# Patient Record
Sex: Male | Born: 1990 | Hispanic: No | Marital: Married | Smoking: Never smoker
Health system: Southern US, Community
[De-identification: ages and names within clinical notes are randomized; demographics above are authoritative.]

## PROBLEM LIST (undated history)

## (undated) DIAGNOSIS — R079 Chest pain, unspecified: Secondary | ICD-10-CM

## (undated) DIAGNOSIS — R0602 Shortness of breath: Secondary | ICD-10-CM

## (undated) HISTORY — DX: Chest pain, unspecified: R07.9

## (undated) HISTORY — DX: Shortness of breath: R06.02

---
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
## 2008-11-27 ENCOUNTER — Ambulatory Visit: Payer: Self-pay | Admitting: Cardiology

## 2008-11-27 DIAGNOSIS — R079 Chest pain, unspecified: Secondary | ICD-10-CM | POA: Insufficient documentation

## 2008-11-27 DIAGNOSIS — R002 Palpitations: Secondary | ICD-10-CM | POA: Insufficient documentation

## 2009-02-08 ENCOUNTER — Observation Stay (HOSPITAL_COMMUNITY): Admission: EM | Admit: 2009-02-08 | Discharge: 2009-02-09 | Payer: Self-pay | Admitting: Emergency Medicine

## 2009-03-11 ENCOUNTER — Emergency Department (HOSPITAL_COMMUNITY): Admission: EM | Admit: 2009-03-11 | Discharge: 2009-03-12 | Payer: Self-pay | Admitting: Emergency Medicine

## 2010-07-31 LAB — PROTIME-INR
INR: 1.05 (ref 0.00–1.49)
Prothrombin Time: 13.6 seconds (ref 11.6–15.2)

## 2010-07-31 LAB — POCT I-STAT, CHEM 8
BUN: 11 mg/dL (ref 6–23)
Calcium, Ion: 1.16 mmol/L (ref 1.12–1.32)
Chloride: 107 mEq/L (ref 96–112)
HCT: 44 % (ref 39.0–52.0)

## 2011-12-04 ENCOUNTER — Encounter: Payer: Self-pay | Admitting: Cardiology

## 2016-02-01 ENCOUNTER — Encounter (HOSPITAL_BASED_OUTPATIENT_CLINIC_OR_DEPARTMENT_OTHER): Payer: Self-pay | Admitting: Emergency Medicine

## 2016-02-01 ENCOUNTER — Emergency Department (HOSPITAL_BASED_OUTPATIENT_CLINIC_OR_DEPARTMENT_OTHER)
Admission: EM | Admit: 2016-02-01 | Discharge: 2016-02-01 | Disposition: A | Discharge status: Home / Self Care | Payer: Worker's Compensation | Attending: Emergency Medicine | Admitting: Emergency Medicine

## 2016-02-01 ENCOUNTER — Emergency Department (HOSPITAL_BASED_OUTPATIENT_CLINIC_OR_DEPARTMENT_OTHER): Payer: Worker's Compensation

## 2016-02-01 DIAGNOSIS — S161XXA Strain of muscle, fascia and tendon at neck level, initial encounter: Secondary | ICD-10-CM | POA: Insufficient documentation

## 2016-02-01 DIAGNOSIS — Y92410 Unspecified street and highway as the place of occurrence of the external cause: Secondary | ICD-10-CM | POA: Diagnosis not present

## 2016-02-01 DIAGNOSIS — S0990XA Unspecified injury of head, initial encounter: Secondary | ICD-10-CM | POA: Diagnosis present

## 2016-02-01 DIAGNOSIS — S20219A Contusion of unspecified front wall of thorax, initial encounter: Secondary | ICD-10-CM | POA: Insufficient documentation

## 2016-02-01 DIAGNOSIS — S0091XA Abrasion of unspecified part of head, initial encounter: Secondary | ICD-10-CM | POA: Insufficient documentation

## 2016-02-01 DIAGNOSIS — R079 Chest pain, unspecified: Secondary | ICD-10-CM | POA: Diagnosis not present

## 2016-02-01 DIAGNOSIS — Y9389 Activity, other specified: Secondary | ICD-10-CM | POA: Diagnosis not present

## 2016-02-01 DIAGNOSIS — S3992XA Unspecified injury of lower back, initial encounter: Secondary | ICD-10-CM | POA: Diagnosis not present

## 2016-02-01 DIAGNOSIS — Y999 Unspecified external cause status: Secondary | ICD-10-CM | POA: Diagnosis not present

## 2016-02-01 MED ORDER — HYDROCODONE-ACETAMINOPHEN 5-325 MG PO TABS
1.0000 | ORAL_TABLET | Freq: Four times a day (QID) | ORAL | 0 refills | Status: AC | PRN
Start: 2016-02-01 — End: ?

## 2016-02-01 MED ORDER — IBUPROFEN 800 MG PO TABS: 800.0000 mg | ORAL_TABLET | Freq: Three times a day (TID) | ORAL | 0 refills | Status: AC | PRN

## 2016-02-01 MED ORDER — IOPAMIDOL (ISOVUE-300) INJECTION 61%
100.0000 mL | Freq: Once | INTRAVENOUS | Status: AC | PRN
  Administered 2016-02-01: 100 mL via INTRAVENOUS
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
## 2016-02-01 NOTE — Discharge Instructions (Signed)
Return here as needed.  Follow-up with your primary care doctor.  Your CT scans were negative.  Use ice and heat on the areas that are sore
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
